# Patient Record
Sex: Female | Born: 1995 | Race: White | Hispanic: No | Marital: Single
Health system: Southern US, Community
[De-identification: ages and names within clinical notes are randomized; demographics above are authoritative.]

---

## 2021-06-23 ENCOUNTER — Encounter (HOSPITAL_COMMUNITY): Payer: Self-pay

## 2021-06-23 ENCOUNTER — Emergency Department (HOSPITAL_COMMUNITY): Payer: BC Managed Care – PPO

## 2021-06-23 ENCOUNTER — Emergency Department (HOSPITAL_COMMUNITY)
Admission: EM | Admit: 2021-06-23 | Discharge: 2021-06-24 | Disposition: A | Discharge status: Home / Self Care | Payer: BC Managed Care – PPO | Attending: Emergency Medicine | Admitting: Emergency Medicine

## 2021-06-23 ENCOUNTER — Other Ambulatory Visit: Payer: Self-pay

## 2021-06-23 DIAGNOSIS — R112 Nausea with vomiting, unspecified: Secondary | ICD-10-CM | POA: Insufficient documentation

## 2021-06-23 DIAGNOSIS — R519 Headache, unspecified: Secondary | ICD-10-CM | POA: Insufficient documentation

## 2021-06-23 DIAGNOSIS — H53149 Visual discomfort, unspecified: Secondary | ICD-10-CM | POA: Insufficient documentation

## 2021-06-23 DIAGNOSIS — Z20822 Contact with and (suspected) exposure to covid-19: Secondary | ICD-10-CM | POA: Insufficient documentation

## 2021-06-23 LAB — URINALYSIS, ROUTINE W REFLEX MICROSCOPIC
Bacteria, UA: NONE SEEN
Bilirubin Urine: NEGATIVE
Glucose, UA: NEGATIVE mg/dL
Hgb urine dipstick: NEGATIVE
Ketones, ur: 5 mg/dL — AB
Leukocytes,Ua: NEGATIVE
Nitrite: NEGATIVE
Protein, ur: 100 mg/dL — AB
Specific Gravity, Urine: 1.032 — ABNORMAL HIGH (ref 1.005–1.030)
pH: 5 (ref 5.0–8.0)

## 2021-06-23 LAB — COMPREHENSIVE METABOLIC PANEL
ALT: 22 U/L (ref 0–44)
AST: 17 U/L (ref 15–41)
Albumin: 4.4 g/dL (ref 3.5–5.0)
Alkaline Phosphatase: 68 U/L (ref 38–126)
Anion gap: 10 (ref 5–15)
BUN: 10 mg/dL (ref 6–20)
CO2: 23 mmol/L (ref 22–32)
Calcium: 9.4 mg/dL (ref 8.9–10.3)
Chloride: 105 mmol/L (ref 98–111)
Creatinine, Ser: 0.57 mg/dL (ref 0.44–1.00)
GFR, Estimated: >60 mL/min (ref >60)
Glucose, Bld: 109 mg/dL — ABNORMAL HIGH (ref 70–99)
Potassium: 3.7 mmol/L (ref 3.5–5.1)
Sodium: 138 mmol/L (ref 135–145)
Total Bilirubin: 0.5 mg/dL (ref 0.3–1.2)
Total Protein: 8.6 g/dL — ABNORMAL HIGH (ref 6.5–8.1)

## 2021-06-23 LAB — I-STAT BETA HCG BLOOD, ED (MC, WL, AP ONLY): I-stat hCG, quantitative: <5 m[IU]/mL (ref <5)

## 2021-06-23 LAB — CBC
HCT: 40.4 % (ref 36.0–46.0)
Hemoglobin: 12.7 g/dL (ref 12.0–15.0)
MCH: 24.4 pg — ABNORMAL LOW (ref 26.0–34.0)
MCHC: 31.4 g/dL (ref 30.0–36.0)
MCV: 77.7 fL — ABNORMAL LOW (ref 80.0–100.0)
Platelets: 432 10*3/uL — ABNORMAL HIGH (ref 150–400)
RBC: 5.2 MIL/uL — ABNORMAL HIGH (ref 3.87–5.11)
RDW: 14.9 % (ref 11.5–15.5)
WBC: 14.4 10*3/uL — ABNORMAL HIGH (ref 4.0–10.5)
nRBC: 0 % (ref 0.0–0.2)

## 2021-06-23 LAB — LIPASE, BLOOD: Lipase: 36 U/L (ref 11–51)

## 2021-06-23 IMAGING — CT CT HEAD W/O CM
3 series · 15 of 47 positions shown, 18 images · non-contrast
Comparison: None.

CLINICAL DATA: Headache, chronic, new features or increased
frequency.



[Series 2: head wo · axial · 0.47mm/px · z∈[+1530,+1655]mm · 9 of 30 slices shown, 12 images]
[im 3/30  brain]
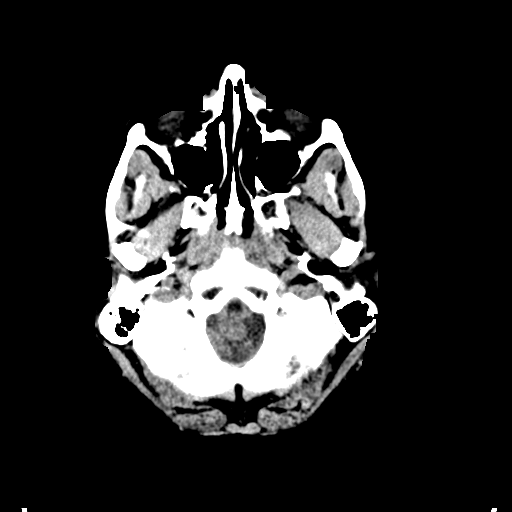
[im 3/30  bone]
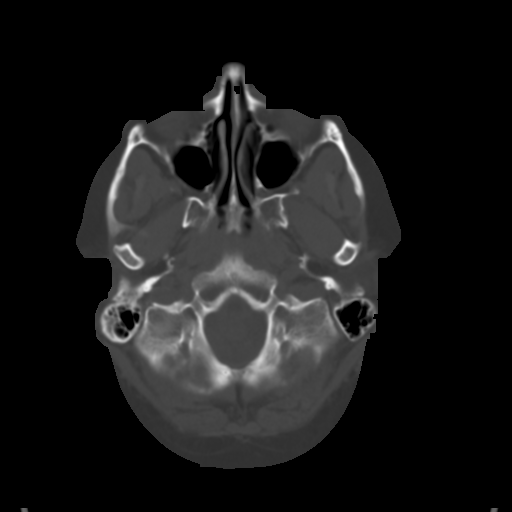
[im 6/30  brain]
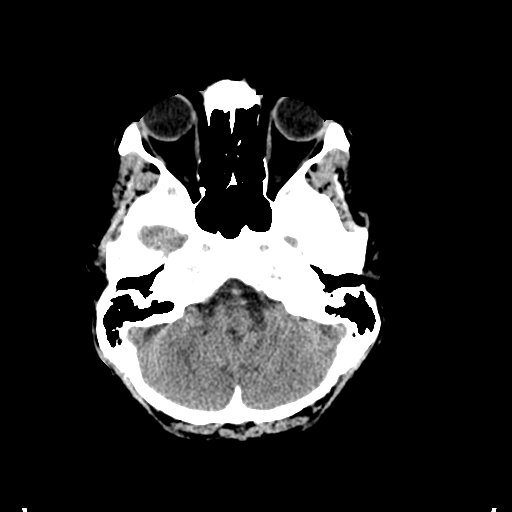
[im 9/30  brain]
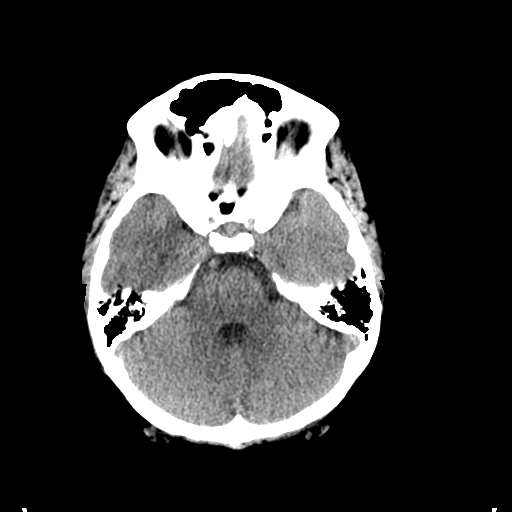
[im 12/30  brain]
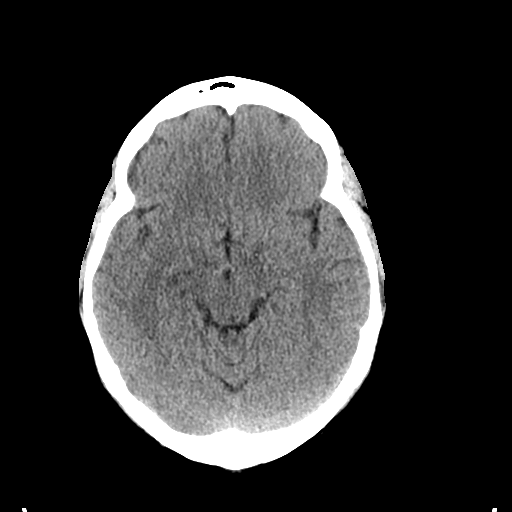
[im 16/30  brain]
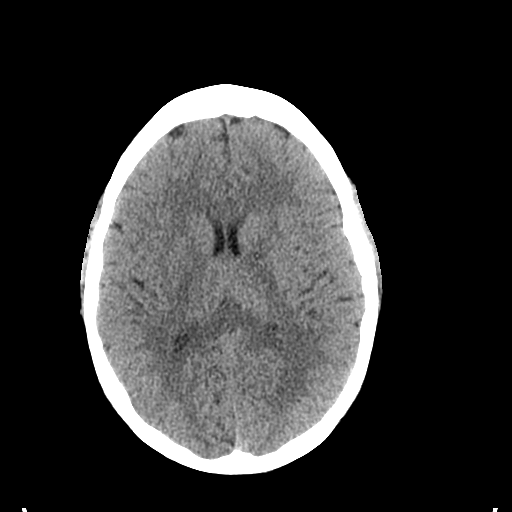
[im 16/30  bone]
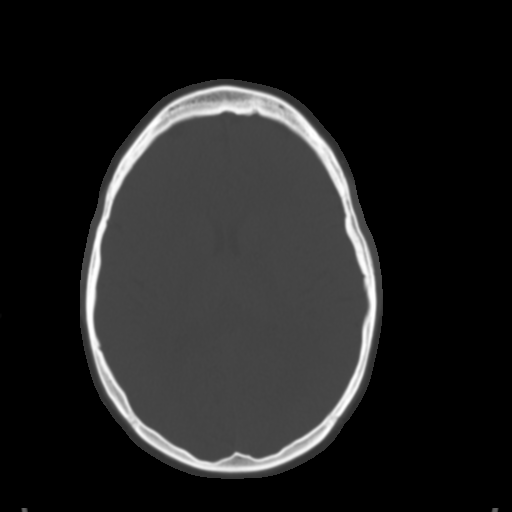
[im 19/30  brain]
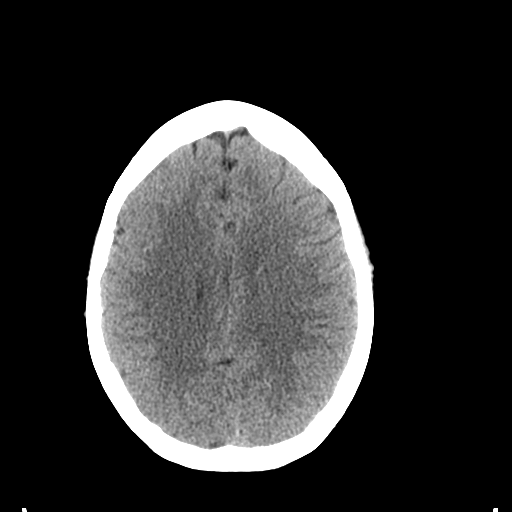
[im 22/30  brain]
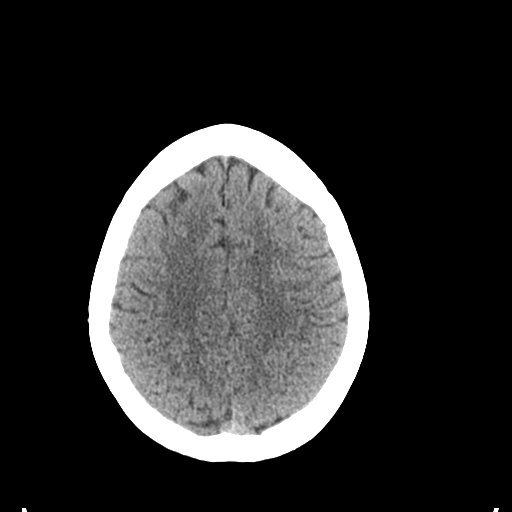
[im 25/30  brain]
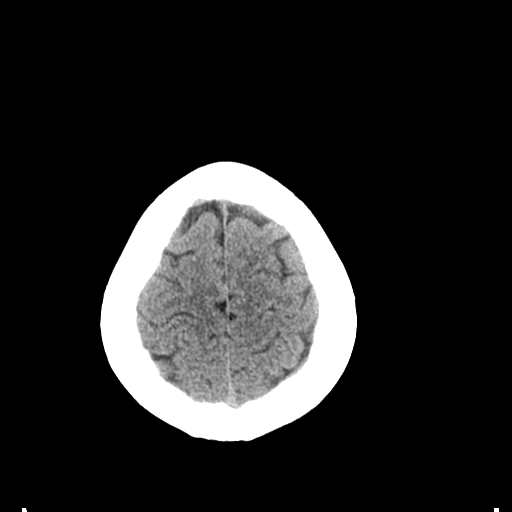
[im 28/30  brain]
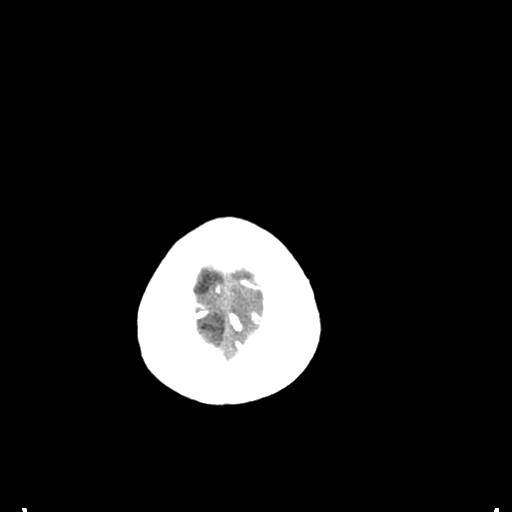
[im 28/30  bone]
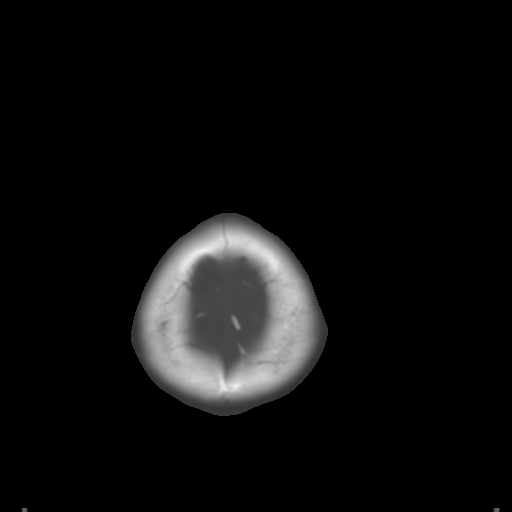

[Series 4: coronal soft tissue · coronal · 0.29mm/px · 3 of 64 slices shown]
[im 22/64  brain]
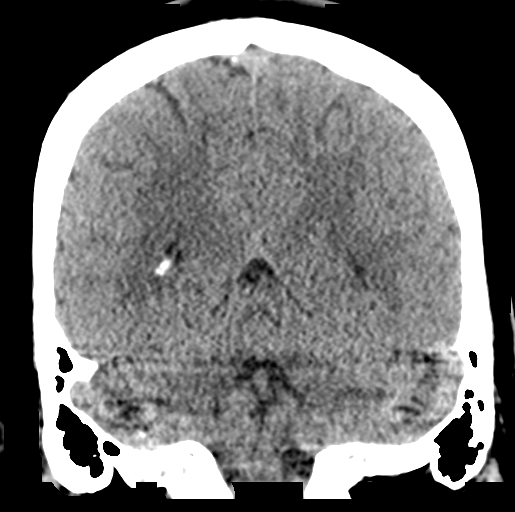
[im 29/64  brain]
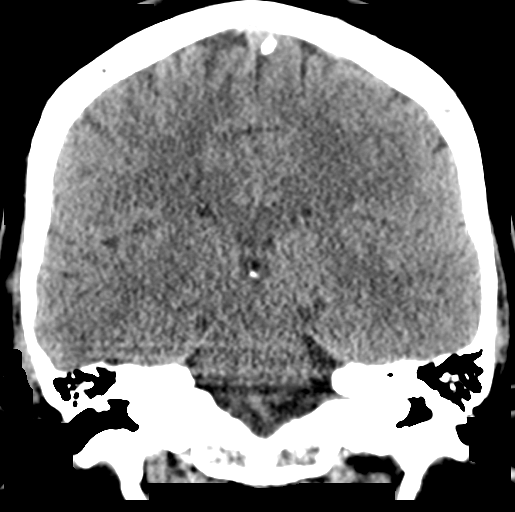
[im 36/64  brain]
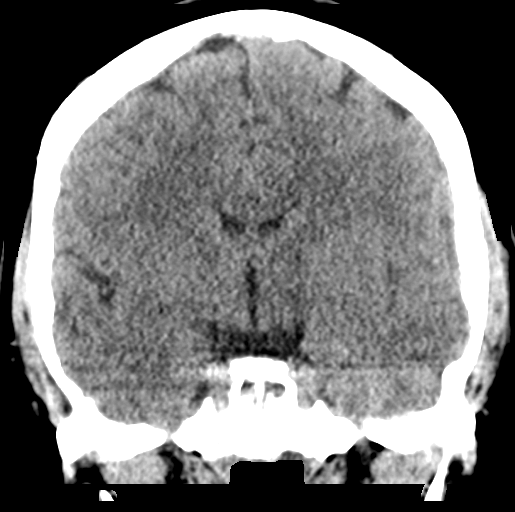

[Series 5: sagittal soft tissue · sagittal · 0.29mm/px · 3 of 50 slices shown]
[im 17/50  brain]
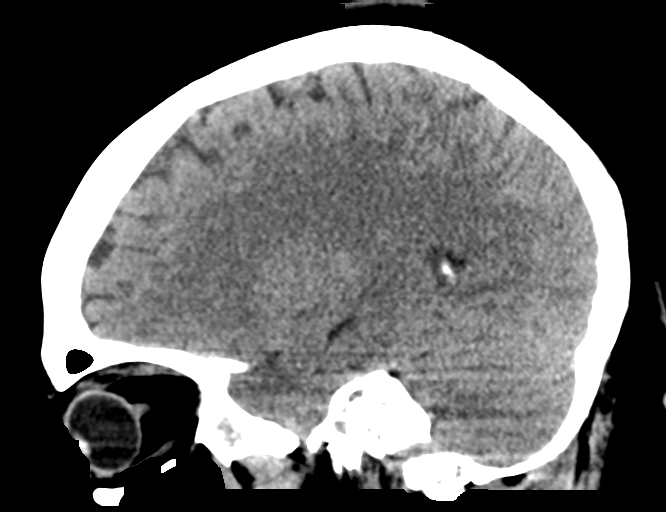
[im 25/50  brain]
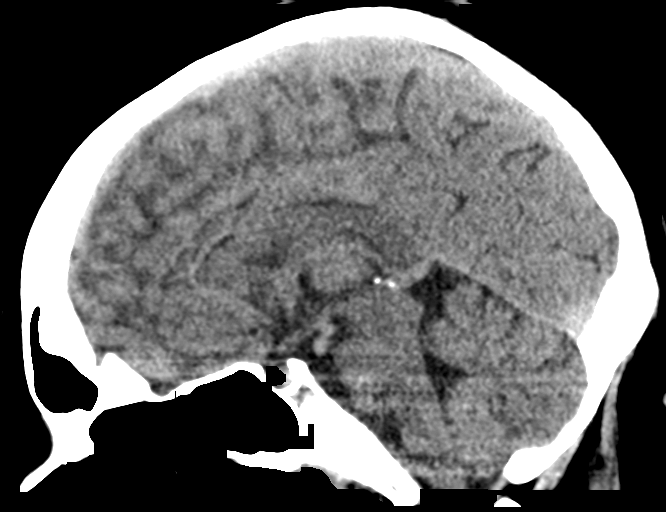
[im 33/50  brain]
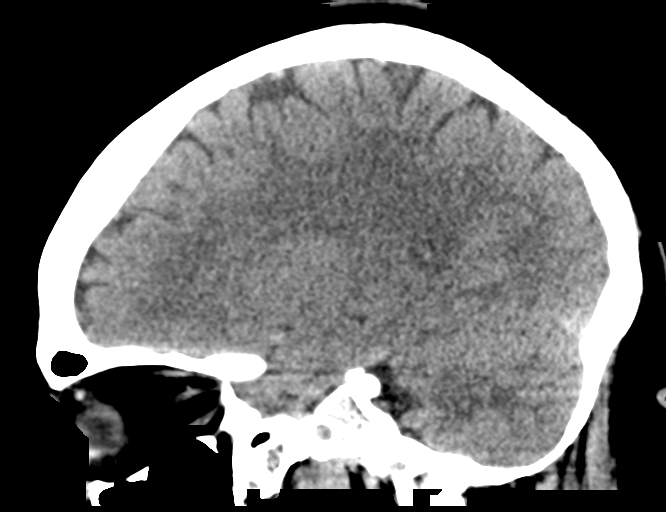

[15 of 47 positions shown; findings below may reference images not displayed]

FINDINGS: Brain: No acute intracranial hemorrhage, midline shift or mass
effect. No extra-axial fluid collection. Gray-white matter
differentiation is within normal limits. No hydrocephalus. There is
a 6 mm oval slightly hyperdense focus in the corona radiata on the
left, axial image 18.

Vascular: No hyperdense vessel or unexpected calcification.

Skull: Normal. Negative for fracture or focal lesion.

Sinuses/Orbits: No acute finding.

Other: None.
IMPRESSION: 1. No acute intracranial process.
2. Oval slightly hyperdense focus in the corona radiata on the left
which is indeterminate in etiology. MRI is suggested for further
evaluation on follow-up.

## 2021-06-23 MED ORDER — SODIUM CHLORIDE 0.9 % IV BOLUS
1000.0000 mL | Freq: Once | INTRAVENOUS | Status: AC
  Administered 2021-06-24: 1000 mL via INTRAVENOUS

## 2021-06-23 MED ORDER — ACETAMINOPHEN 500 MG PO TABS
1000.0000 mg | ORAL_TABLET | Freq: Once | ORAL | Status: AC
  Administered 2021-06-23: 1000 mg via ORAL
  Filled 2021-06-23: qty 2

## 2021-06-23 MED ORDER — ONDANSETRON HCL 4 MG/2ML IJ SOLN
4.0000 mg | Freq: Once | INTRAMUSCULAR | Status: AC
  Administered 2021-06-23: 4 mg via INTRAVENOUS
  Filled 2021-06-23: qty 2

## 2021-06-23 NOTE — ED Triage Notes (Signed)
Pt reports headache, nausea and vomiting. HA started Tuesday, N/V started today and is unable to keep anything down. Denies abd pain.

## 2021-06-23 NOTE — ED Provider Notes (Signed)
[facility] DEPT Provider Note   CSN: [id number] Arrival date & time: 06/23/21  1845     History  Chief Complaint  Patient presents with   Emesis    Christine Campos is a 26 y.o. female.  HPI Patient is a 26 year old female with no pertinent medical history presents to the emergency department due to headaches as well as nausea and vomiting.  Patient states that last week she was diagnosed with strep throat and is currently taking amoxicillin.  She states that about 2 days ago she began experiencing a frontal headache that is been intermittent.  She states that starts in the front of her head and radiates backwards.  Reports associated photophobia as well as nausea/vomiting that began last night.  She reports little p.o. intake today due to her nausea and vomiting.  Denies any fevers, cough, rhinorrhea, nuchal rigidity, chest pain, shortness of breath, abdominal pain, urinary complaints, numbness, weakness, visual changes.    Home Medications Prior to Admission medications   Not on File      Allergies    Patient has no known allergies.    Review of Systems   Review of Systems  All other systems reviewed and are negative. Ten systems reviewed and are negative for acute change, except as noted in the HPI.   Physical Exam Updated Vital Signs BP (!) 149/107    Pulse (!) 49    Temp 98.1 F (36.7 C) (Oral)    Resp 18    Ht 5\' 8"  (1.727 m)    Wt 136.1 kg    LMP 06/16/2021 (Approximate) Comment: negative beta HCG 06/23/21   SpO2 98%    BMI 45.61 kg/m  Physical Exam Vitals and nursing note reviewed.  Constitutional:      General: She is not in acute distress.    Appearance: Normal appearance. She is not ill-appearing, toxic-appearing or diaphoretic.  HENT:     Head: Normocephalic and atraumatic.     Right Ear: External ear normal.     Left Ear: External ear normal.     Nose: Nose normal.     Mouth/Throat:     Mouth: Mucous membranes are moist.      Pharynx: Oropharynx is clear. No oropharyngeal exudate or posterior oropharyngeal erythema.     Comments: Uvula midline.  No erythema or exudates noted in the posterior oropharynx.  No hot potato voice.  Readily handling secretions. Eyes:     General: No scleral icterus.       Right eye: No discharge.        Left eye: No discharge.     Extraocular Movements: Extraocular movements intact.     Conjunctiva/sclera: Conjunctivae normal.  Neck:     Comments: No cervical lymphadenopathy.  Full range of motion of the cervical spine.  No nuchal rigidity. Cardiovascular:     Rate and Rhythm: Normal rate and regular rhythm.     Pulses: Normal pulses.     Heart sounds: Normal heart sounds. No murmur heard.   No friction rub. No gallop.  Pulmonary:     Effort: Pulmonary effort is normal. No respiratory distress.     Breath sounds: Normal breath sounds. No stridor. No wheezing, rhonchi or rales.  Abdominal:     General: Abdomen is flat.     Palpations: Abdomen is soft.     Tenderness: There is no abdominal tenderness.     Comments: Abdomen is soft and nontender.  Musculoskeletal:  General: Normal range of motion.     Cervical back: Normal range of motion and neck supple. No tenderness.  Skin:    General: Skin is warm and dry.  Neurological:     General: No focal deficit present.     Mental Status: She is alert and oriented to person, place, and time.     Comments: A&O x3.  Speaking clearly, coherently, and in complete sentences.  Moving all 4 extremities with ease.  No gross deficits.  Psychiatric:        Mood and Affect: Mood normal.        Behavior: Behavior normal.    ED Results / Procedures / Treatments   Labs (all labs ordered are listed, but only abnormal results are displayed) Labs Reviewed  COMPREHENSIVE METABOLIC PANEL - Abnormal; Notable for the following components:      Result Value   Glucose, Bld 109 (*)    Total Protein 8.6 (*)    All other components within normal  limits  CBC - Abnormal; Notable for the following components:   WBC 14.4 (*)    RBC 5.20 (*)    MCV 77.7 (*)    MCH 24.4 (*)    Platelets 432 (*)    All other components within normal limits  URINALYSIS, ROUTINE W REFLEX MICROSCOPIC - Abnormal; Notable for the following components:   APPearance HAZY (*)    Specific Gravity, Urine 1.032 (*)    Ketones, ur 5 (*)    Protein, ur 100 (*)    All other components within normal limits  RESP PANEL BY RT-PCR (FLU A&B, COVID) ARPGX2  GROUP A STREP BY PCR  LIPASE, BLOOD  I-STAT BETA HCG BLOOD, ED ([facility], [facility], [facility] ONLY)   EKG None  Radiology CT HEAD WO CONTRAST ( )  Result Date: 06/24/2021 CLINICAL DATA:  Headache, chronic, new features or increased frequency. EXAM: CT HEAD WITHOUT CONTRAST TECHNIQUE: Contiguous axial images were obtained from the base of the skull through the vertex without intravenous contrast. RADIATION DOSE REDUCTION: This exam was performed according to the departmental dose-optimization program which includes automated exposure control, adjustment of the mA and/or kV according to patient size and/or use of iterative reconstruction technique. COMPARISON:  None. FINDINGS: Brain: No acute intracranial hemorrhage, midline shift or mass effect. No extra-axial fluid collection. Gray-white matter differentiation is within normal limits. No hydrocephalus. There is a 6 mm oval slightly hyperdense focus in the corona radiata on the left, axial image 18. Vascular: No hyperdense vessel or unexpected calcification. Skull: Normal. Negative for fracture or focal lesion. Sinuses/Orbits: No acute finding. Other: None. IMPRESSION: 1. No acute intracranial process. 2. Oval slightly hyperdense focus in the corona radiata on the left which is indeterminate in etiology. MRI is suggested for further evaluation on follow-up. Electronically Signed   By: Thornell Sartorius M.D.   On: 06/24/2021 00:11    Procedures Procedures   Medications Ordered in  ED Medications  ondansetron (ZOFRAN) injection 4 mg (4 mg Intravenous Given 06/23/21 2354)  sodium chloride 0.9 % bolus 1,000 mL (1,000 mLs Intravenous New Bag/Given 06/24/21 0007)  acetaminophen (TYLENOL) tablet 1,000 mg (1,000 mg Oral Given 06/23/21 2353)  metoCLOPramide (REGLAN) injection 5 mg (5 mg Intravenous Given 06/24/21 0039)  diphenhydrAMINE (BENADRYL) injection 25 mg (25 mg Intravenous Given 06/24/21 0039)   ED Course/ Medical Decision Making/ A&P Clinical Course as of 06/24/21 0617  Fri Jun 24, 2021  0027 Patient discussed with Dr. Amada Jupiter with neurology.  He personally evaluated  the CT scan as well.  Recommends an MRI with and without contrast as well as an MRV.  Treat patient with a migraine cocktail. [LJ]  0053 WBC(!): 14.4 Diagnosis strep throat last week.  Denies any steroid use.  We will obtain a repeat test. [LJ]    Clinical Course User Index [LJ] Rayna Sexton, PA-C                           Medical Decision Making Amount and/or Complexity of Data Reviewed Labs: ordered. Decision-making details documented in ED Course. Radiology: ordered.  Risk OTC drugs. Prescription drug management.  Pt is a 26 y.o. female who presents to the emergency department due to headache as well as nausea and vomiting.  Patient diagnosed with strep throat last week and was started on a course of amoxicillin which she is still taking.  About 2 days ago she began experiencing a frontal headache which has been intermittent.  It starts in the front of her head and radiates backwards.  She reports associated photophobia as well as nausea and vomiting that began last night.  Labs: CBC with a white count of 14.4, RBCs of 5.2, MCV of 77.7, MCH 24.4, platelets of 432. Lipase of 36. CMP with a glucose of 109 and a total protein of 8.6. UA with an elevated specific gravity, 5 ketones, 100 protein, 6-10 white blood cells, 11-20 squamous epithelial cells. I-STAT beta-hCG less than 5. Respiratory  panel is negative. Group A strep by PCR is negative.  Imaging: CT scan of the head without contrast shows no acute intracranial process.  Oval slightly hyperdense focus in the corona radiata on the left which is indeterminate in etiology.  MRI suggested for further evaluation on follow-up.  I, Rayna Sexton, PA-C, personally reviewed and evaluated these images and lab results as part of my medical decision-making.  CT scan obtained with findings as noted above.  This was discussed with Dr. Leonel Ramsay with neurology.  Recommends follow-up MRI with and without contrast as well as MRV.  We do not have MRI overnight in this facility so patient will board in the ED overnight pending this imaging in the morning.  Neurology is in agreement with this plan.  These have been ordered.  Patient is also agreeable with this plan.  Physical exam otherwise generally reassuring.  Moving all 4 extremities with ease.  No gross deficits.  A&O x3.  No nuchal rigidity.  Given her recent positive strep test this was repeated which is negative.  Respiratory panel is negative.    It is the end of my shift and patient care is being transferred to [facility].  Patient pending MRI and MRV of the brain.  Note: Portions of this report may have been transcribed using voice recognition software. Every effort was made to ensure accuracy; however, inadvertent computerized transcription errors may be present.   Final Clinical Impression(s) / ED Diagnoses Final diagnoses:  Bad headache  Non-intractable vomiting with nausea   Rx / DC Orders ED Discharge Orders     None         Rayna Sexton, PA-C 06/24/21 0617    Palumbo, April, MD 06/24/21 [phone]

## 2021-06-24 ENCOUNTER — Emergency Department (HOSPITAL_COMMUNITY): Payer: BC Managed Care – PPO

## 2021-06-24 LAB — RESP PANEL BY RT-PCR (FLU A&B, COVID) ARPGX2
Influenza A by PCR: NEGATIVE
Influenza B by PCR: NEGATIVE
SARS Coronavirus 2 by RT PCR: NEGATIVE

## 2021-06-24 LAB — GROUP A STREP BY PCR: Group A Strep by PCR: NOT DETECTED

## 2021-06-24 IMAGING — MR MR HEAD WO/W CM
13 series · 48 of 48 positions shown · IV contrast (gadavist)
Comparison: Head CT [DATE].

CLINICAL DATA: 25-year-old female with headache, vomiting.
Currently being treated for strep throat.

EXAM:
MRI HEAD WITHOUT AND WITH CONTRAST
MR VENOGRAM HEAD WITHOUT AND WITH CONTRAST
TECHNIQUE: Multiplanar, multi-echo pulse sequences of the brain and surrounding
structures were acquired without and with intravenous contrast.
Angiographic images of the intracranial venous structures were
acquired using MRV technique without and with intravenous contrast.
CONTRAST:  10mL GADAVIST GADOBUTROL 1 MMOL/ML IV SOLN

[Series 5: DWI · axial · 3.0mm · 1.36mm/px · z∈[-36,+115]mm · 5 of 104 slices shown (1 of 4)]
[im 1/104]
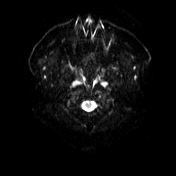
[im 26/104]
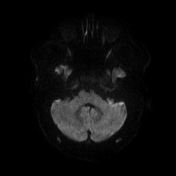
[im 52/104]
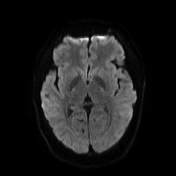
[im 78/104]
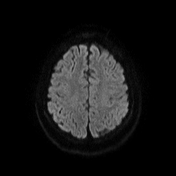
[im 104/104]
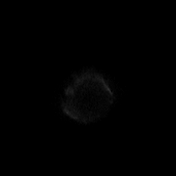

[Series 6: DWI · axial · 3.0mm · 1.36mm/px · z∈[-36,+115]mm · 3 of 52 slices shown (2 of 4)]
[im 1/52]
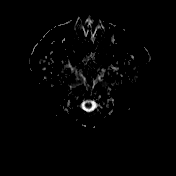
[im 26/52]
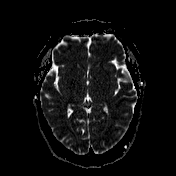
[im 52/52]
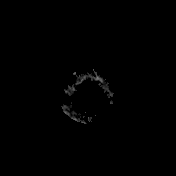

[Series 7: T1 · sagittal · 5.0mm · 0.75mm/px · 1 of 24 slices shown (1 of 2)]
[im 1/24]
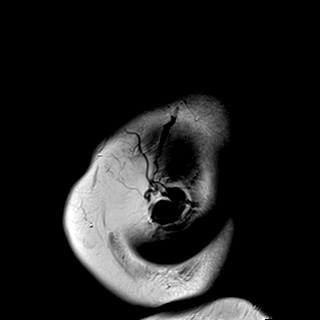

[Series 8: T2 · axial · 5.0mm · 0.62mm/px · 1 of 26 slices shown (1 of 2)]
[im 1/26]
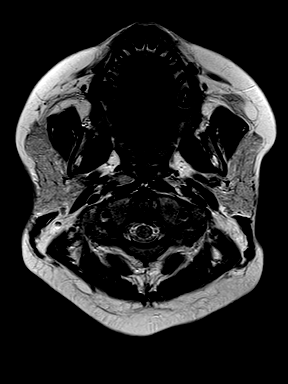

[Series 9: FLAIR · axial · 3.0mm · 0.75mm/px · z∈[-46,+106]mm · 3 of 52 slices shown]
[im 1/52]
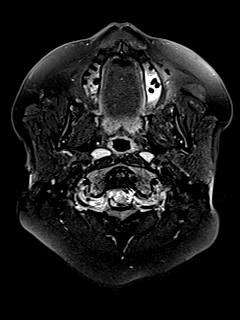
[im 26/52]
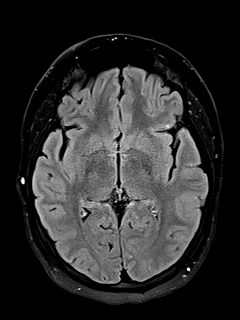
[im 52/52]
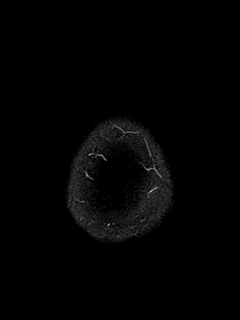

[Series 10: swi_images · axial · 3.0mm · 0.75mm/px · z∈[-56,+109]mm · 3 of 56 slices shown]
[im 1/56]
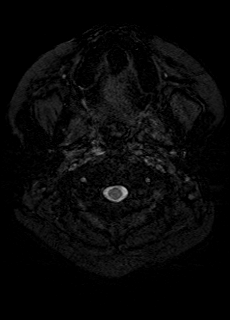
[im 28/56]
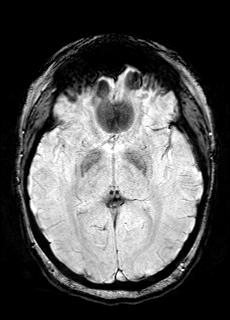
[im 56/56]
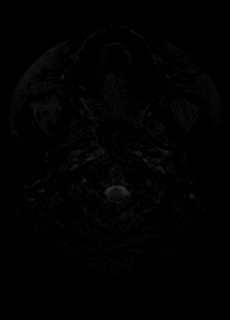

[Series 12: T1 · axial · 1.0mm · 0.94mm/px · z∈[-53,+106]mm · 8 of 160 slices shown (2 of 2)]
[im 1/160]
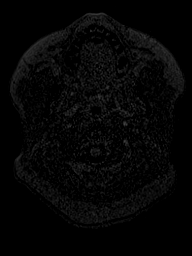
[im 23/160]
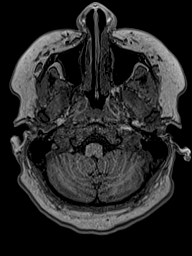
[im 46/160]
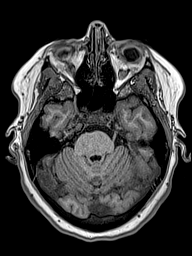
[im 69/160]
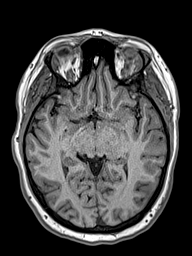
[im 91/160]
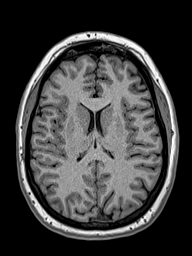
[im 114/160]
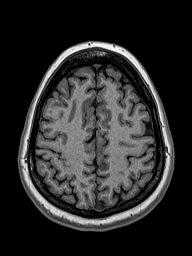
[im 137/160]
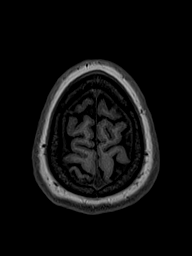
[im 160/160]
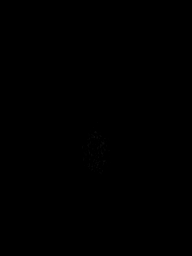

[Series 13: DWI · coronal · 5.0mm · 1.31mm/px · 3 of 68 slices shown (3 of 4)]
[im 1/68]
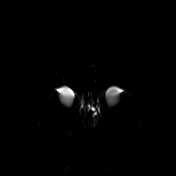
[im 34/68]
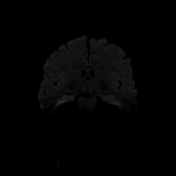
[im 68/68]
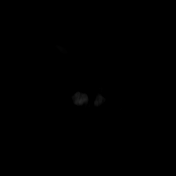

[Series 14: DWI · coronal · 5.0mm · 1.31mm/px · 2 of 34 slices shown (4 of 4)]
[im 1/34]
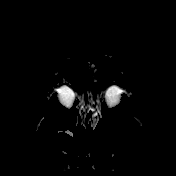
[im 34/34]
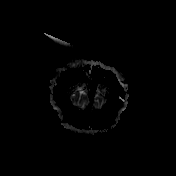

[Series 15: T2 · coronal · 5.0mm · 0.57mm/px · 1 of 27 slices shown (2 of 2)]
[im 1/27]
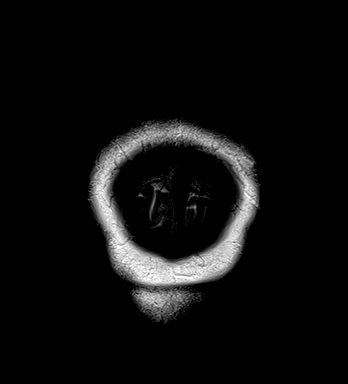

[Series 16: T1 post-contrast · sagittal · 1.0mm · 0.94mm/px · 8 of 160 slices shown (1 of 3)]
[im 1/160]
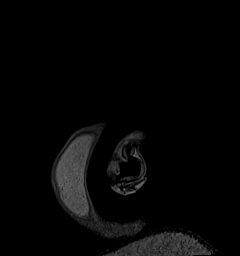
[im 23/160]
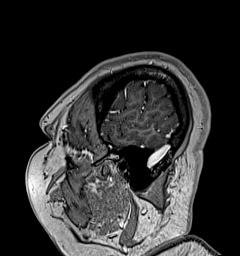
[im 46/160]
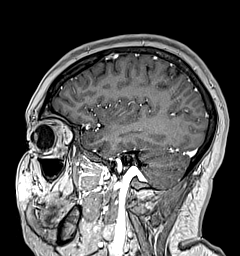
[im 69/160]
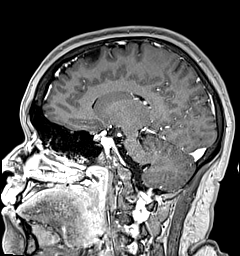
[im 91/160]
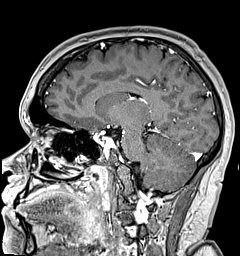
[im 114/160]
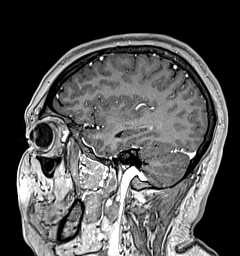
[im 137/160]
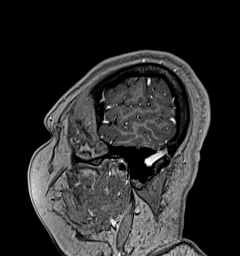
[im 160/160]
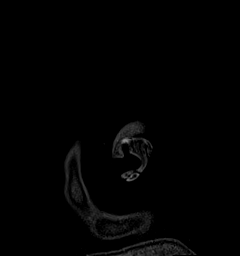

[Series 17: T1 post-contrast · axial · 1.0mm · 0.94mm/px · z∈[-62,+113]mm · 9 of 176 slices shown (2 of 3)]
[im 1/176]
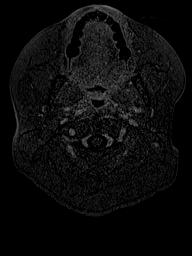
[im 22/176]
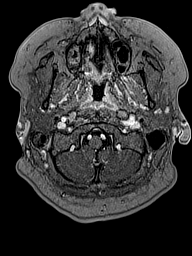
[im 44/176]
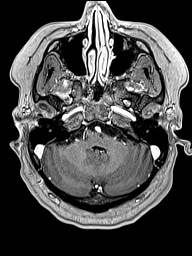
[im 66/176]
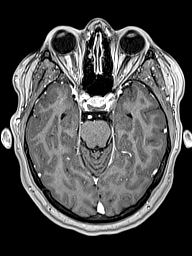
[im 88/176]
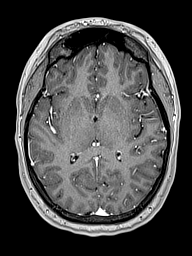
[im 110/176]
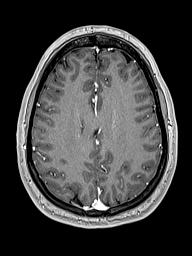
[im 132/176]
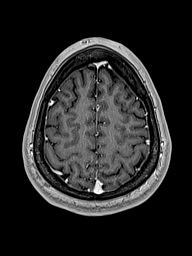
[im 154/176]
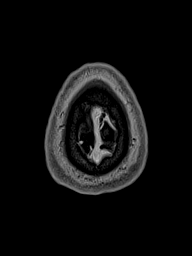
[im 176/176]
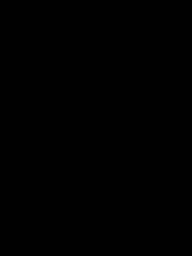

[Series 18: T1 post-contrast · coronal · 5.0mm · 0.43mm/px · 1 of 30 slices shown (3 of 3)]
[im 1/30]
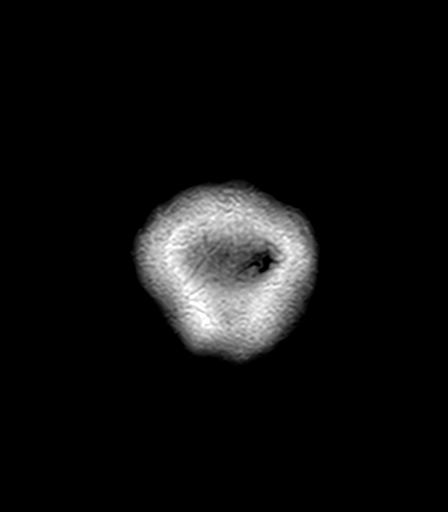

[48 of 48 positions shown; findings below may reference images not displayed]

FINDINGS: MRI HEAD WITHOUT AND WITH CONTRAST

Brain: No restricted diffusion to suggest acute infarction. No
midline shift, mass effect, evidence of mass lesion,
ventriculomegaly, extra-axial collection or acute intracranial
hemorrhage. Cervicomedullary junction and pituitary are within
normal limits.

Gray and white matter signal is within normal limits throughout the
brain.

No abnormal enhancement identified.  No dural thickening.

Vascular: Major intracranial vascular flow voids are preserved.
There is some mild intracranial artery tortuosity, mostly in the
posterior circulation.

Skull and upper cervical spine: Negative visible cervical spine.
Visualized bone marrow signal is within normal limits.

Sinuses/Orbits: Negative.  Paranasal sinuses and mastoids are clear.

Other: Trace retained secretions in the nasopharynx. Otherwise
negative visible scalp and face. Upper cervical lymph nodes are at
the upper limits of normal.

MR VENOGRAM HEAD WITHOUT AND WITH CONTRAST

2D and 3D time-of-flight images and postcontrast MRV images.

Preserved flow signal and enhancement in the superior sagittal
sinus, torcula, straight sinus, vein of BRASWELL, internal cerebral
veins, basal veins of BRASWELL, inferior sagittal sinus, bilateral
transverse sinuses, bilateral sigmoid sinuses, and IJ bulbs.
Postcontrast images demonstrate enhancement and unremarkable
appearance of the cavernous sinuses. The major cortical draining
veins also appear patent.

No evidence of intracranial venous thrombosis.
IMPRESSION: 1. Normal MRI appearance of the brain.
2. Normal intracranial MRV.

## 2021-06-24 IMAGING — MR MR MRV HEAD WO/W CM
2 of 4 series · 30 of 48 positions shown · IV contrast (gadavist)
Comparison: Head CT [DATE].

CLINICAL DATA: 25-year-old female with headache, vomiting.
Currently being treated for strep throat.

EXAM:
MRI HEAD WITHOUT AND WITH CONTRAST
MR VENOGRAM HEAD WITHOUT AND WITH CONTRAST
TECHNIQUE: Multiplanar, multi-echo pulse sequences of the brain and surrounding
structures were acquired without and with intravenous contrast.
Angiographic images of the intracranial venous structures were
acquired using MRV technique without and with intravenous contrast.
CONTRAST:  10mL GADAVIST GADOBUTROL 1 MMOL/ML IV SOLN

[Series 4: cor 2d · coronal · 2.5mm · 0.69mm/px · 13 of 122 slices shown]
[im 1/122]
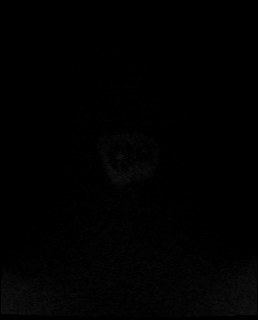
[im 11/122]
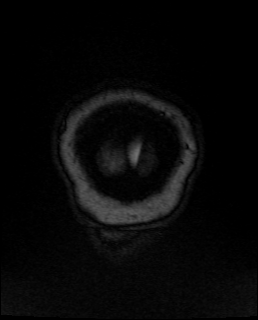
[im 21/122]
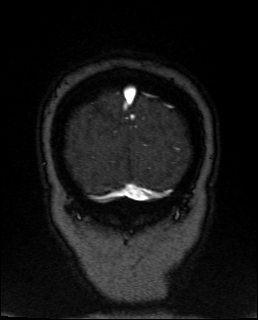
[im 31/122]
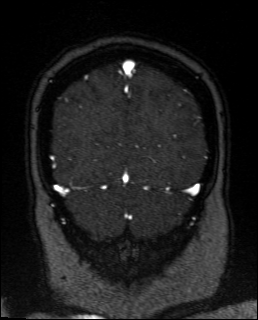
[im 41/122]
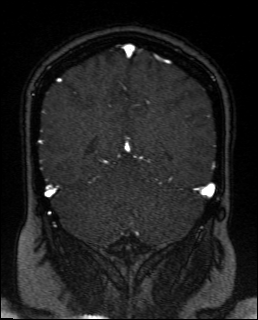
[im 51/122]
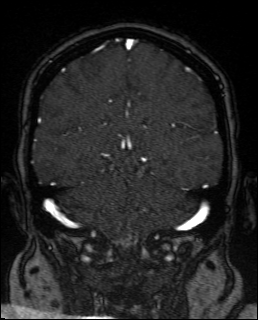
[im 61/122]
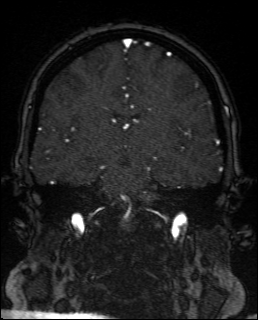
[im 71/122]
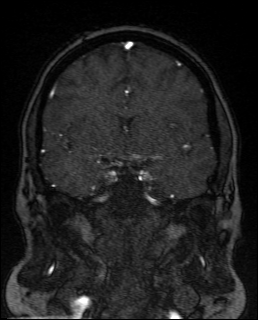
[im 81/122]
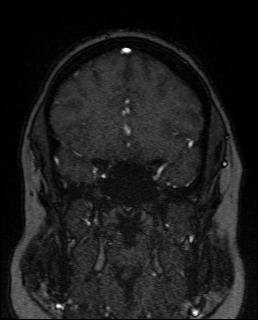
[im 91/122]
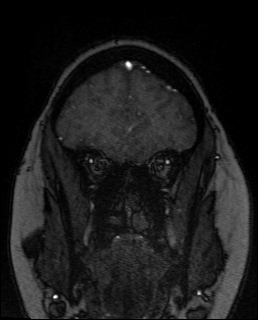
[im 101/122]
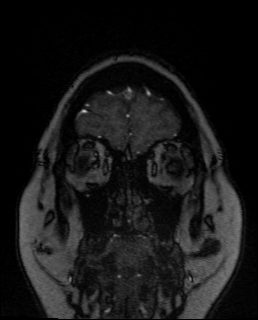
[im 111/122]
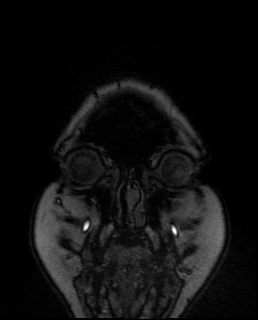
[im 122/122]
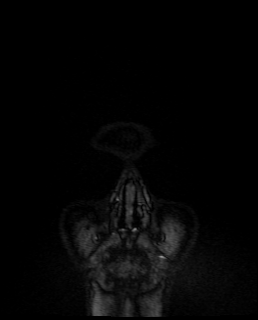

[Series 12: T1 post-contrast · sagittal · 1.0mm · 0.94mm/px · 17 of 160 slices shown]
[im 1/160]
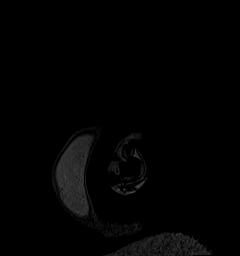
[im 10/160]
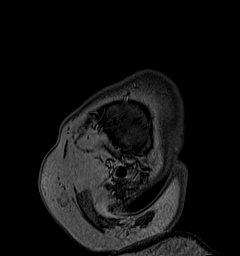
[im 20/160]
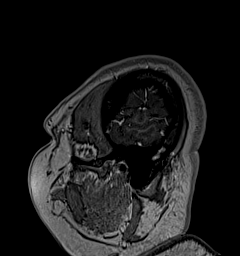
[im 30/160]
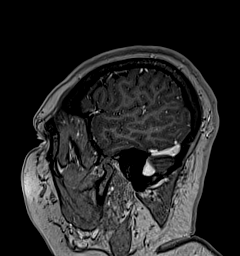
[im 40/160]
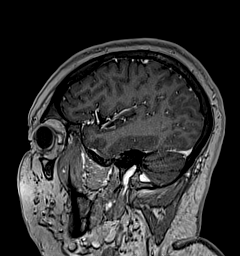
[im 50/160]
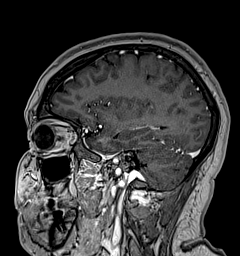
[im 60/160]
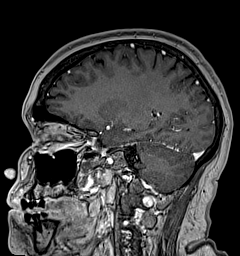
[im 70/160]
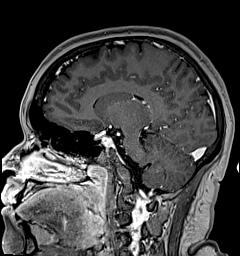
[im 80/160]
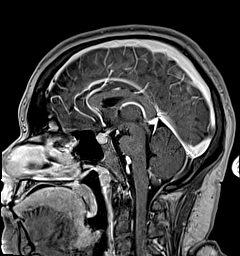
[im 90/160]
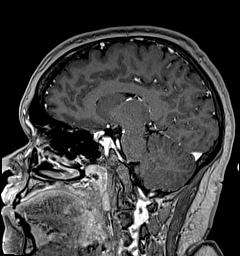
[im 100/160]
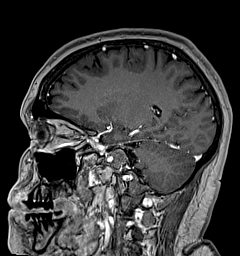
[im 110/160]
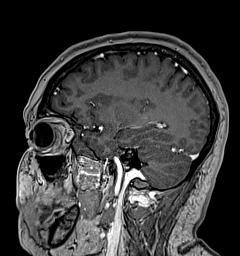
[im 120/160]
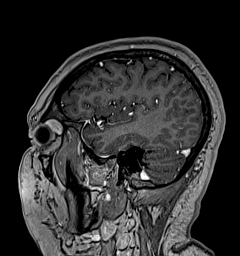
[im 130/160]
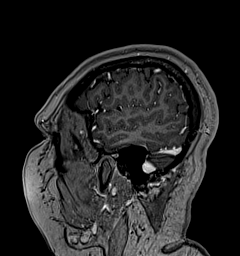
[im 140/160]
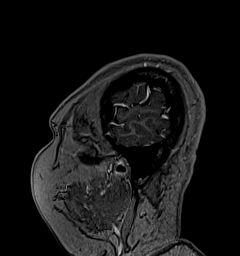
[im 150/160]
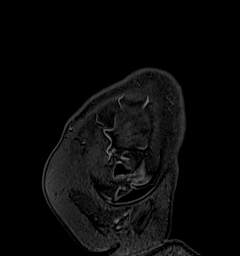
[im 160/160]
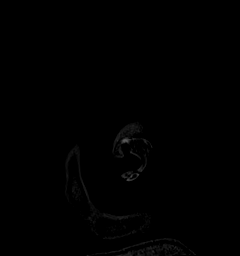

[30 of 48 positions shown; findings below may reference images not displayed]

FINDINGS: MRI HEAD WITHOUT AND WITH CONTRAST

Brain: No restricted diffusion to suggest acute infarction. No
midline shift, mass effect, evidence of mass lesion,
ventriculomegaly, extra-axial collection or acute intracranial
hemorrhage. Cervicomedullary junction and pituitary are within
normal limits.

Gray and white matter signal is within normal limits throughout the
brain.

No abnormal enhancement identified.  No dural thickening.

Vascular: Major intracranial vascular flow voids are preserved.
There is some mild intracranial artery tortuosity, mostly in the
posterior circulation.

Skull and upper cervical spine: Negative visible cervical spine.
Visualized bone marrow signal is within normal limits.

Sinuses/Orbits: Negative.  Paranasal sinuses and mastoids are clear.

Other: Trace retained secretions in the nasopharynx. Otherwise
negative visible scalp and face. Upper cervical lymph nodes are at
the upper limits of normal.

MR VENOGRAM HEAD WITHOUT AND WITH CONTRAST

2D and 3D time-of-flight images and postcontrast MRV images.

Preserved flow signal and enhancement in the superior sagittal
sinus, torcula, straight sinus, vein of BRASWELL, internal cerebral
veins, basal veins of BRASWELL, inferior sagittal sinus, bilateral
transverse sinuses, bilateral sigmoid sinuses, and IJ bulbs.
Postcontrast images demonstrate enhancement and unremarkable
appearance of the cavernous sinuses. The major cortical draining
veins also appear patent.

No evidence of intracranial venous thrombosis.
IMPRESSION: 1. Normal MRI appearance of the brain.
2. Normal intracranial MRV.

## 2021-06-24 MED ORDER — METOCLOPRAMIDE HCL 5 MG/ML IJ SOLN
5.0000 mg | Freq: Once | INTRAMUSCULAR | Status: AC
  Administered 2021-06-24: 5 mg via INTRAVENOUS
  Filled 2021-06-24: qty 2

## 2021-06-24 MED ORDER — DIPHENHYDRAMINE HCL 50 MG/ML IJ SOLN
25.0000 mg | Freq: Once | INTRAMUSCULAR | Status: AC
  Administered 2021-06-24: 25 mg via INTRAVENOUS
  Filled 2021-06-24: qty 1

## 2021-06-24 MED ORDER — GADOBUTROL 1 MMOL/ML IV SOLN
10.0000 mL | Freq: Once | INTRAVENOUS | Status: AC | PRN
  Administered 2021-06-24: 10 mL via INTRAVENOUS

## 2021-06-24 MED ORDER — ONDANSETRON HCL 4 MG PO TABS: 4.0000 mg | ORAL_TABLET | Freq: Four times a day (QID) | ORAL | 0 refills | Status: AC

## 2021-06-24 NOTE — Discharge Instructions (Addendum)
Please use Tylenol or ibuprofen for pain.  You may use 600 mg ibuprofen every 6 hours or 1000 mg of Tylenol every 6 hours.  You may choose to alternate between the 2.  This would be most effective.  Not to exceed 4 g of Tylenol within 24 hours.  Not to exceed 3200 mg ibuprofen 24 hours.  You can use Excedrin instead of the above if you would like to try some specific medication for headaches.  Encourage you to drink plenty fluids.  Please return the emergency department if your symptoms worsen.

## 2021-06-24 NOTE — ED Provider Notes (Signed)
Accepted handoff at shift change from [facility]. Please see prior provider note for more detail.   Briefly: Patient is 27 y.o.   DDX: concern for pulsating headache radiating backwards, recent strep infection.  Patient had received a CT prior to handoff which showed possible hyperdensity in the corona radiata.  Spoke with neurology at that time who thought that this may have been an over read, but did request MRI, MRV for further evaluation.  Plan:   I personally reviewed results of MRI, MRV, showed no evidence of dural venous thrombosis, or other intra cranial abnormality.  I agree with radiologist interpretation.  On reassessment patient appears comfortable, reports headache is largely improved with some mild pulsing, sensitivity to light still remaining.  Improvement of nausea.  Patient with continuation of no focal neurodeficits on reassessment.  Encouraged continued use of fluids, ibuprofen, Tylenol and we will discharge with prescription for Zofran.  Encouraged follow-up if symptoms worsen or fail to improve.  Patient discharged in stable condition at this time return, precautions are given.      Olene Floss, PA-C 06/24/21 0745    Cathren Laine, MD 06/24/21 [phone]
